# Patient Record
Sex: Male | Born: 1990 | Race: White | Hispanic: No | Marital: Single | Smoking: Current every day smoker
Health system: Southern US, Community
[De-identification: ages and names within clinical notes are randomized; demographics above are authoritative.]

## PROBLEM LIST (undated history)

## (undated) DIAGNOSIS — A630 Anogenital (venereal) warts: Secondary | ICD-10-CM

---

## 2009-03-21 ENCOUNTER — Ambulatory Visit: Payer: Self-pay | Admitting: Psychiatry

## 2009-03-21 ENCOUNTER — Inpatient Hospital Stay (HOSPITAL_COMMUNITY): Admission: EM | Admit: 2009-03-21 | Discharge: 2009-03-22 | Payer: Self-pay | Admitting: Psychiatry

## 2009-03-21 ENCOUNTER — Ambulatory Visit (HOSPITAL_COMMUNITY): Admission: RE | Admit: 2009-03-21 | Discharge: 2009-03-21 | Payer: Self-pay | Admitting: Psychiatry

## 2010-10-19 LAB — COMPREHENSIVE METABOLIC PANEL
ALT: 29 U/L (ref 0–53)
Alkaline Phosphatase: 57 U/L (ref 39–117)
CO2: 28 mEq/L (ref 19–32)
Chloride: 105 mEq/L (ref 96–112)
GFR calc non Af Amer: >60 mL/min (ref >60)
Glucose, Bld: 89 mg/dL (ref 70–99)
Potassium: 3.6 mEq/L (ref 3.5–5.1)
Sodium: 141 mEq/L (ref 135–145)
Total Bilirubin: 0.6 mg/dL (ref 0.3–1.2)

## 2010-10-19 LAB — TSH: TSH: 2.264 u[IU]/mL (ref 0.700–6.400)

## 2010-10-19 LAB — CBC
Hemoglobin: 17 g/dL (ref 13.0–17.0)
MCHC: 34.6 g/dL (ref 30.0–36.0)
RBC: 5.22 MIL/uL (ref 4.22–5.81)
WBC: 9.9 10*3/uL (ref 4.0–10.5)

## 2011-02-04 IMAGING — CT CT HEAD W/O CM
1 series · 16 of 30 positions shown, 20 images · non-contrast
Comparison: None

CLINICAL DATA: Evaluate neurology problems.  The patient complains
of hallucinations, blackouts, syncope, and headaches.

CT HEAD WITHOUT CONTRAST
TECHNIQUE: Contiguous axial images were obtained from the base of
the skull through the vertex without contrast.

[Series 2: headseq 4.8 h45s · axial · 0.43mm/px · z∈[-148,+4]mm · 16 of 36 slices shown, 20 images]
[im 2/36  brain]
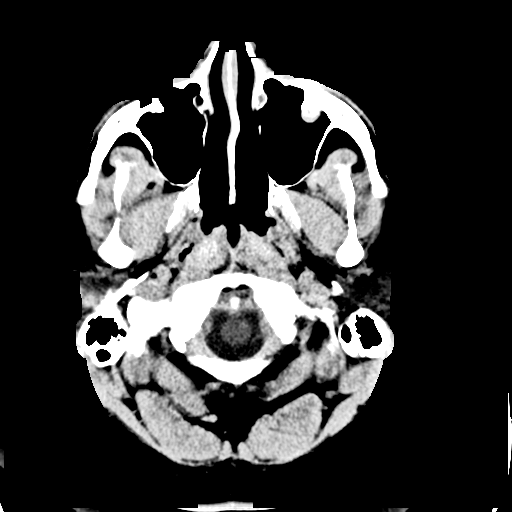
[im 2/36  bone]
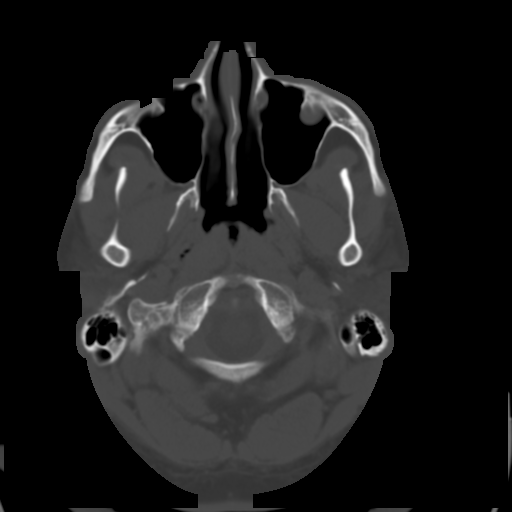
[im 4/36  brain]
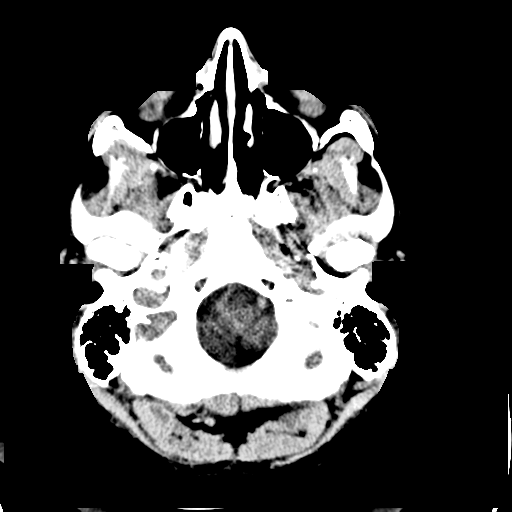
[im 7/36  brain]
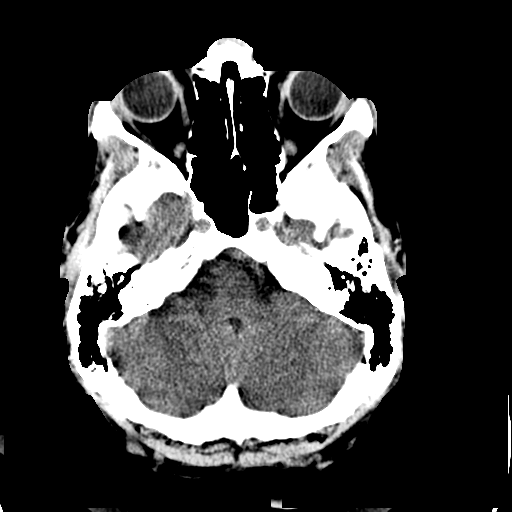
[im 9/36  brain]
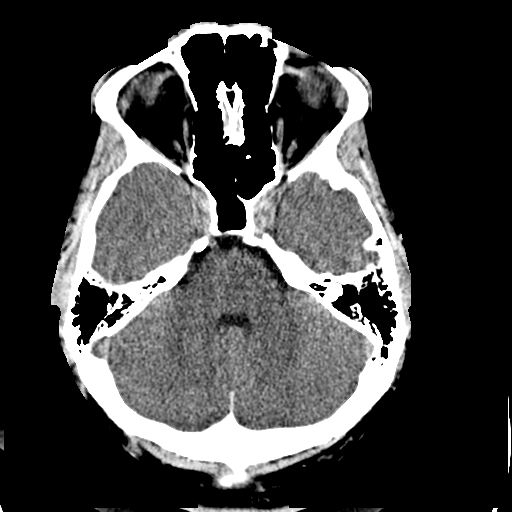
[im 10/36  brain]
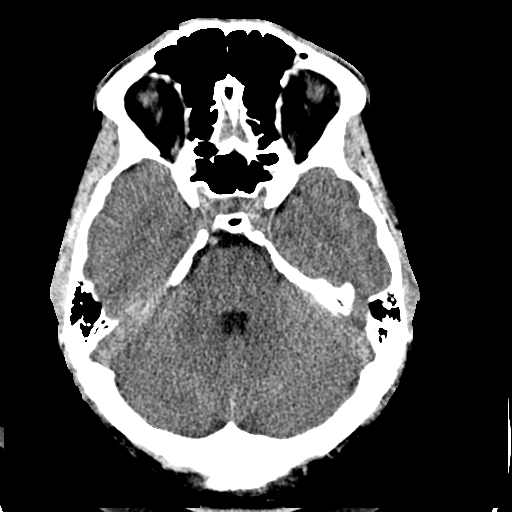
[im 10/36  bone]
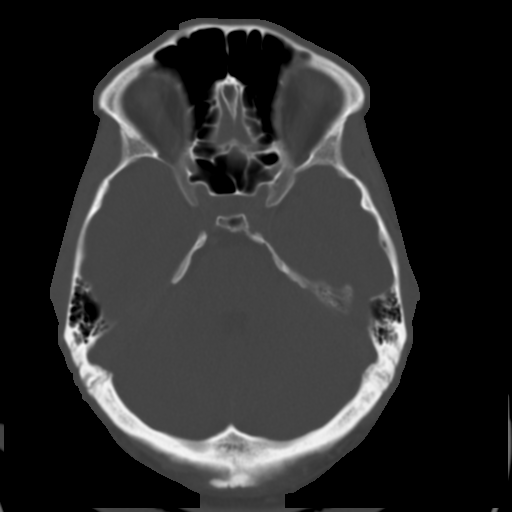
[im 13/36  brain]
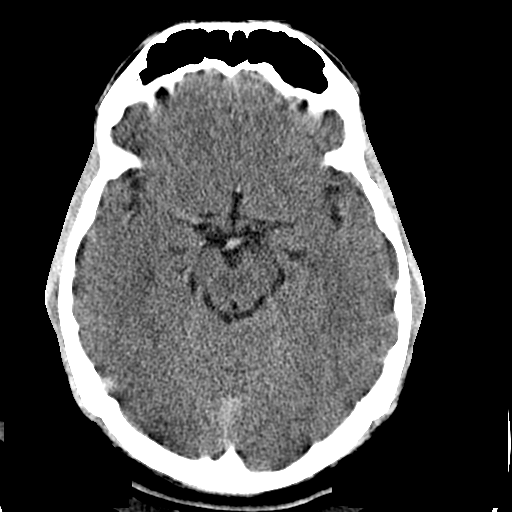
[im 15/36  brain]
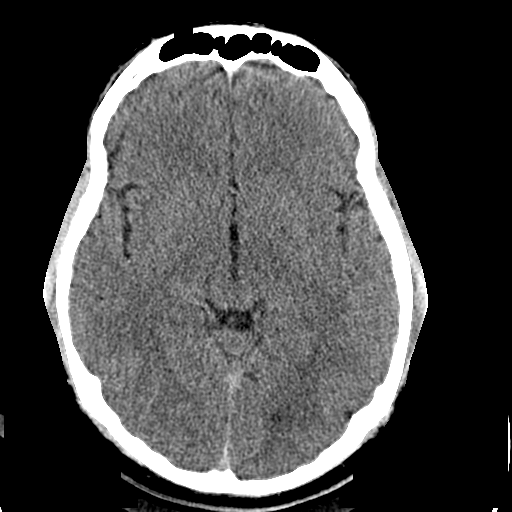
[im 17/36  brain]
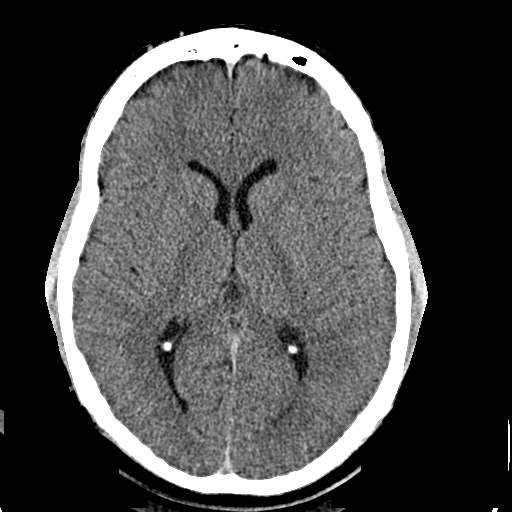
[im 19/36  brain]
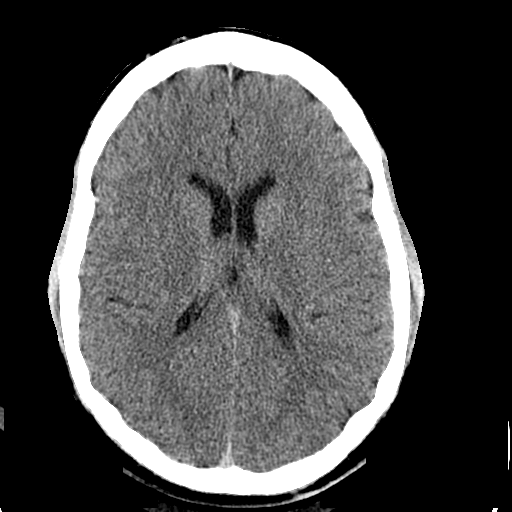
[im 19/36  bone]
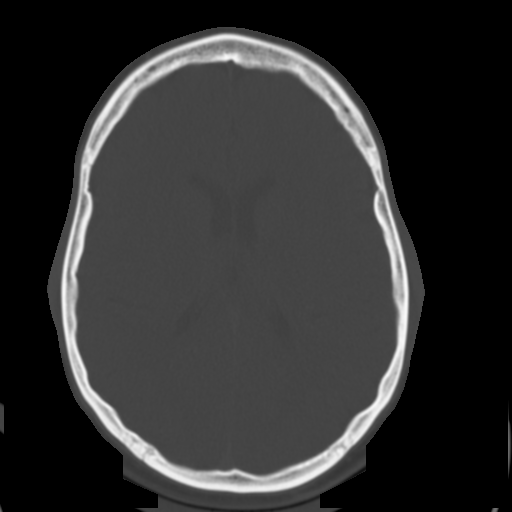
[im 21/36  brain]
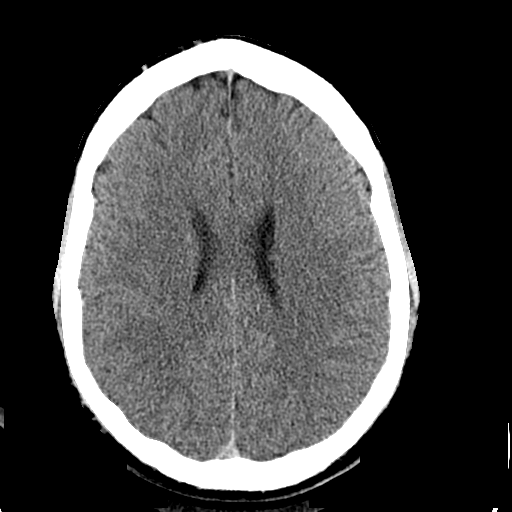
[im 23/36  brain]
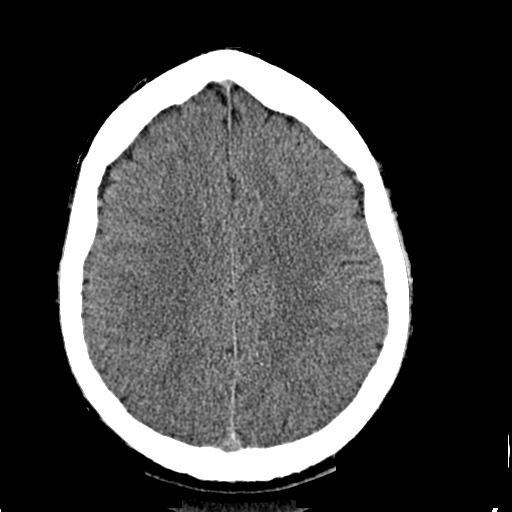
[im 26/36  brain]
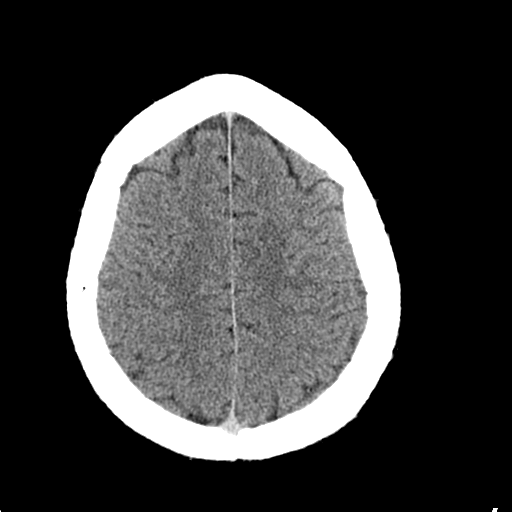
[im 27/36  brain]
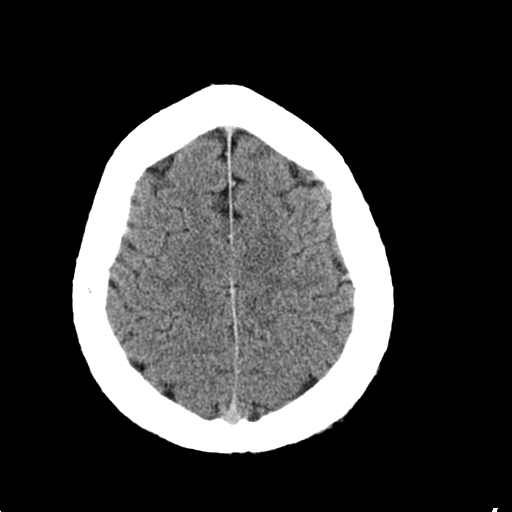
[im 27/36  bone]
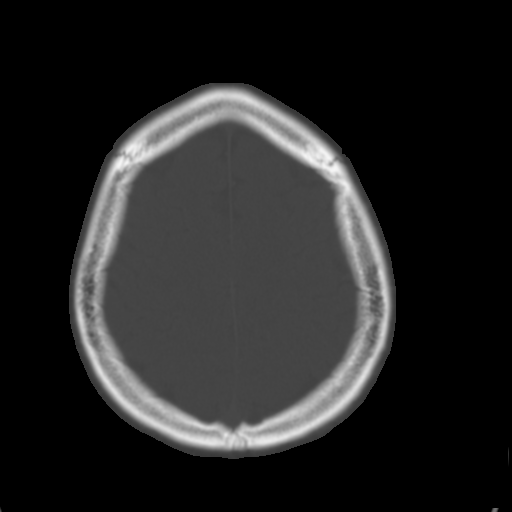
[im 29/36  brain]
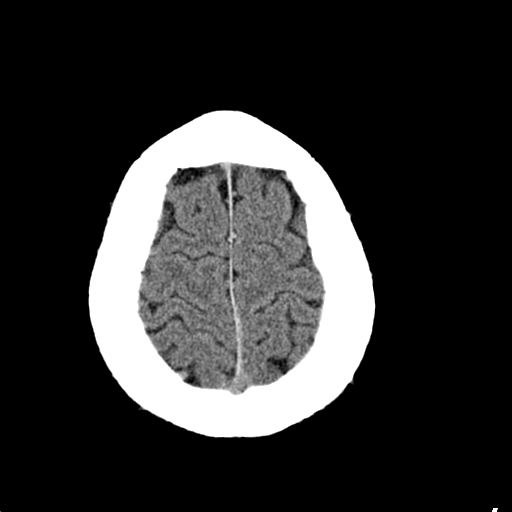
[im 32/36  brain]
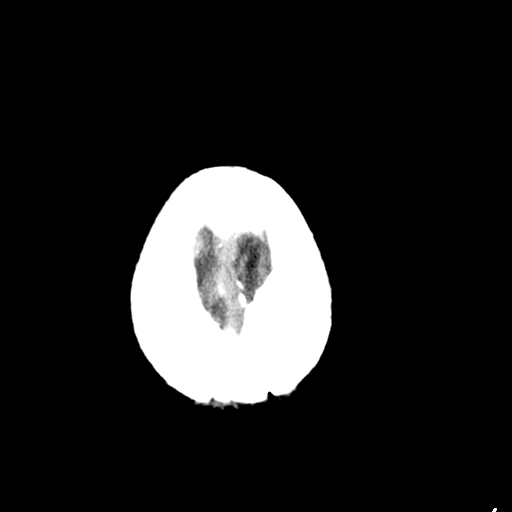
[im 34/36  brain]
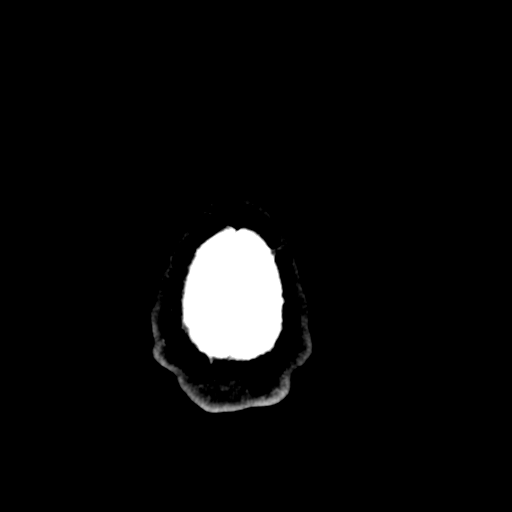

[16 of 30 positions shown; findings below may reference images not displayed]

FINDINGS: No intra or extra-axial hemorrhage, mass effect,
hydrocephalus, mass lesion, or midline shift is identified.  No
evidence of acute infarction is identified.  Soft tissues of the
orbits and scalp are unremarkable.  There is focal polypoid
thickening of the anterior left maxillary sinus measuring at least
9 mm and incompletely visualized.  No air-fluid levels are
identified in the visualized paranasal sinuses.  The visualized
mastoid air cells are clear.  Middle ears are clear.  The scalp is
intact.
IMPRESSION: 1.  No evidence of acute intracranial abnormality.
2.  Probable polyp versus mucous retention cyst in the left
maxillary sinus.

## 2012-03-23 ENCOUNTER — Emergency Department (HOSPITAL_COMMUNITY)
Admission: EM | Admit: 2012-03-23 | Discharge: 2012-03-24 | Discharge status: Against Medical Advice | Payer: BC Managed Care – PPO | Attending: Emergency Medicine | Admitting: Emergency Medicine

## 2012-03-23 ENCOUNTER — Encounter (HOSPITAL_COMMUNITY): Payer: Self-pay | Admitting: Emergency Medicine

## 2012-03-23 DIAGNOSIS — K625 Hemorrhage of anus and rectum: Secondary | ICD-10-CM | POA: Insufficient documentation

## 2012-03-23 HISTORY — DX: Anogenital (venereal) warts: A63.0

## 2012-03-23 NOTE — ED Provider Notes (Signed)
3:47 PM Have gone to the room twice to evaluate the patient and he has not been there.  Gwyneth Sprout, MD 03/23/12 1547

## 2012-03-23 NOTE — ED Notes (Signed)
Pt presenting to ed with c/o rectal bleeding this morning pt states no chest pain, nausea or vomiting. Pt states it only happened once and it was bright red in color. Pt denies dizziness at this time.

## 2014-04-13 ENCOUNTER — Ambulatory Visit (INDEPENDENT_AMBULATORY_CARE_PROVIDER_SITE_OTHER): Payer: BC Managed Care – PPO | Admitting: Family Medicine

## 2014-04-13 VITALS — BP 120/80 | HR 86 | Temp 98.1°F | Resp 16 | Ht 69.0 in | Wt 268.0 lb

## 2014-04-13 DIAGNOSIS — H109 Unspecified conjunctivitis: Secondary | ICD-10-CM

## 2014-04-13 DIAGNOSIS — E669 Obesity, unspecified: Secondary | ICD-10-CM | POA: Insufficient documentation

## 2014-04-13 DIAGNOSIS — H00019 Hordeolum externum unspecified eye, unspecified eyelid: Secondary | ICD-10-CM

## 2014-04-13 DIAGNOSIS — H00016 Hordeolum externum left eye, unspecified eyelid: Secondary | ICD-10-CM

## 2014-04-13 MED ORDER — MOXIFLOXACIN HCL (2X DAY) 0.5 % OP SOLN: 1.0000 [drp] | Freq: Two times a day (BID) | OPHTHALMIC | Status: AC

## 2014-04-13 NOTE — Patient Instructions (Signed)
You seem to have a stye and also mild conjunctivitis. Use the moxeza twice a day for one week, and use hot compresses frequently on your eyelid. If you are not doing better over the next couple of days please let me know- Sooner if worse.

## 2014-04-13 NOTE — Progress Notes (Signed)
[facility] [address], [place] [id number] [phone]  Date:  04/13/2014   Name:  Philip HumbleChristopher Price   DOB:  Aug 05, 1990   MRN:  [id number]  PCP:  No PCP Per Patient    Chief Complaint: Eye Problem   History of Present Illness:  Philip Price is a 23 y.o. very pleasant male patient who presents with the following:  He has noted a "swollen right eye" for about one week.  It seems to be getting worse.  It is not painful or itchy.  His vision is "slightly blurry" but not very much so. He always wears glasses.  He has noted just a little discharge and some am crusting over the last week.   He does not have any pain or photophobia.    He is otherwise generally healthy.   No other sx such as cough or fever.  He has felt tired over the last few weeks but thinks this might be due to stress.    There are no active problems to display for this patient.   Past Medical History  Diagnosis Date  . HPV (human papilloma virus) anogenital infection     History reviewed. No pertinent past surgical history.  History  Substance Use Topics  . Smoking status: Current Every Day Smoker -- 1.00 packs/day    Types: Cigarettes  . Smokeless tobacco: Not on file  . Alcohol Use: No    Family History  Problem Relation Age of Onset  . Cancer Maternal Grandmother   . Heart disease Maternal Grandfather   . High Cholesterol Maternal Grandfather   . High Cholesterol Paternal Grandmother   . Heart disease Paternal Grandmother   . Diabetes Paternal Grandmother   . Cancer Paternal Grandmother   . Diabetes Paternal Grandfather   . Heart disease Paternal Grandfather     No Known Allergies  Medication list has been reviewed and updated.  No current outpatient prescriptions on file prior to visit.   No current facility-administered medications on file prior to visit.    Review of Systems:  As per HPI- otherwise negative.   Physical Examination: Filed Vitals:    04/13/14 1547  BP: 120/80  Pulse: 86  Temp: 98.1 F (36.7 C)  Resp: 16   Filed Vitals:   04/13/14 1547  Height: 5\' 9"  (1.753 m)  Weight: 268 lb (121.564 kg)   Body mass index is 39.56 kg/(m^2). Ideal Body Weight: Weight in (lb) to have BMI = 25: 168.9  GEN: WDWN, NAD, Non-toxic, A & O x , overweight, looks well HEENT: Atraumatic, Normocephalic. Neck supple. No masses, No LAD.  Bilateral TM wnl, oropharynx normal.  PEERL,EOMI.   Ears and Nose: No external deformity. CV: RRR, No M/G/R. No JVD. No thrill. No extra heart sounds. PULM: CTA B, no wheezes, crackles, rhonchi. No retractions. No resp. distress. No accessory muscle use. ABD: S, NT, ND, +BS. No rebound. No HSM. EXTR: No c/c/e NEURO Normal gait.  PSYCH: Normally interactive. Conversant. Not depressed or anxious appearing.  Calm demeanor.  Right eye:  There is a stye on the upper lid which is small and slightly tender. Minimal injection of the right conjuntivae.  Normal fundoscopic exam  Negative fluorescin Assessment and Plan: Conjunctivitis of right eye - Plan: Moxifloxacin HCl 0.5 % SOLN  Stye external, left    Signed Abbe AmsterdamJessica Copland, MD

## 2014-08-12 ENCOUNTER — Ambulatory Visit (HOSPITAL_COMMUNITY): Payer: Self-pay | Admitting: Clinical

## 2014-09-02 ENCOUNTER — Ambulatory Visit (HOSPITAL_COMMUNITY): Payer: Self-pay | Admitting: Psychiatry

## 2015-07-02 ENCOUNTER — Emergency Department (INDEPENDENT_AMBULATORY_CARE_PROVIDER_SITE_OTHER)
Admission: EM | Admit: 2015-07-02 | Discharge: 2015-07-02 | Disposition: A | Discharge status: Home / Self Care | Payer: Self-pay | Source: Home / Self Care | Attending: Emergency Medicine | Admitting: Emergency Medicine

## 2015-07-02 ENCOUNTER — Encounter (HOSPITAL_COMMUNITY): Payer: Self-pay | Admitting: Emergency Medicine

## 2015-07-02 DIAGNOSIS — H109 Unspecified conjunctivitis: Secondary | ICD-10-CM

## 2015-07-02 MED ORDER — ERYTHROMYCIN 5 MG/GM OP OINT: TOPICAL_OINTMENT | OPHTHALMIC | Status: AC

## 2015-07-02 NOTE — Discharge Instructions (Signed)
You have conjunctivitis, or pink eye. This is usually caused by a virus or bacteria. Use the erythromycin ointment 3 times a day. Please call Dr. Laruth BouchardGroat's office tomorrow morning to set up an appointment.

## 2015-07-02 NOTE — ED Notes (Signed)
Seen  by provider only

## 2015-07-02 NOTE — ED Provider Notes (Signed)
CSN: [id number]     Arrival date & time 07/02/15  1506 History   First MD Initiated Contact with Patient 07/02/15 1542     No chief complaint on file. CC: eye irritation  (Consider location/radiation/quality/duration/timing/severity/associated sxs/prior Treatment) HPI  He is a 24 year old man here for evaluation of eye irritation. He states this started 4 or 5 days ago with redness and drainage from the right eye. He states the right eyelid is very heavy. He also reports a lot of drainage from the eye that is causing blurred vision. He states it is hard to keep his right eye open due to the heaviness of the lid. He denies any sensitivity to light. He denies any frank pain or pain with eye movements. He states he is starting to see some redness and watery drainage from the left eye as well. He is concerned about a fungal infection as he had ringworm on his right thigh a week and a half ago.  Past Medical History  Diagnosis Date  . HPV (human papilloma virus) anogenital infection    No past surgical history on file. Family History  Problem Relation Age of Onset  . Cancer Maternal Grandmother   . Heart disease Maternal Grandfather   . High Cholesterol Maternal Grandfather   . High Cholesterol Paternal Grandmother   . Heart disease Paternal Grandmother   . Diabetes Paternal Grandmother   . Cancer Paternal Grandmother   . Diabetes Paternal Grandfather   . Heart disease Paternal Grandfather    Social History  Substance Use Topics  . Smoking status: Current Every Day Smoker -- 1.00 packs/day    Types: Cigarettes  . Smokeless tobacco: Not on file  . Alcohol Use: No    Review of Systems As in history of present illness Allergies  Review of patient's allergies indicates no known allergies.  Home Medications   Prior to Admission medications   Medication Sig Start Date End Date Taking? Authorizing Provider  erythromycin ophthalmic ointment Place a 1/2 inch ribbon of ointment into the  lower eyelid 3 times a day. 07/02/15   Charm RingsErin J Whitleigh Garramone, MD  Moxifloxacin HCl 0.5 % SOLN Apply 1 drop to eye 2 (two) times daily. 04/13/14   Pearline CablesJessica C Copland, MD   Meds Ordered and Administered this Visit  Medications - No data to display  BP 134/82 mmHg  Pulse 109  Temp(Src) 99.5 F (37.5 C) (Oral)  Resp 18  SpO2 97% No data found.   Physical Exam  Constitutional: He is oriented to person, place, and time. He appears well-developed and well-nourished. No distress.  Eyes: EOM are normal. Pupils are equal, round, and reactive to light. Right eye exhibits discharge (thick yellow to white). Left eye exhibits discharge (Watery).  Right upper eyelid is edematous. The right conjunctiva is beefy red and inflamed. Cornea is clear. Left eye with mild conjunctival erythema.  Cardiovascular:  Mild tachycardia  Pulmonary/Chest: Effort normal.  Neurological: He is alert and oriented to person, place, and time.    ED Course  Procedures (including critical care time)  Labs Review Labs Reviewed - No data to display  Imaging Review No results found.   Visual Acuity Review  Right Eye Distance: 20/200 w/corrections Left Eye Distance: 20/40 w/corrections Bilateral Distance: 20/40 w/correctionss  Right Eye Near:   Left Eye Near:    Bilateral Near:         MDM   1. Bilateral conjunctivitis    Given change in vision and extreme redness, I  did speak with Dr. Dione Booze, on-call ophthalmologist. Will start him on erythromycin ointment and have him follow-up at his office tomorrow.    Charm Rings, MD 07/02/15 [phone]

## 2016-05-03 ENCOUNTER — Ambulatory Visit (HOSPITAL_COMMUNITY)
Admission: EM | Admit: 2016-05-03 | Discharge: 2016-05-03 | Disposition: A | Discharge status: Home / Self Care | Payer: Self-pay

## 2016-05-03 ENCOUNTER — Emergency Department (HOSPITAL_COMMUNITY)
Admission: EM | Admit: 2016-05-03 | Discharge: 2016-05-03 | Disposition: A | Discharge status: Home / Self Care | Payer: Self-pay | Attending: Dermatology | Admitting: Dermatology

## 2016-05-03 ENCOUNTER — Encounter (HOSPITAL_COMMUNITY): Payer: Self-pay | Admitting: Nurse Practitioner

## 2016-05-03 DIAGNOSIS — Z5321 Procedure and treatment not carried out due to patient leaving prior to being seen by health care provider: Secondary | ICD-10-CM | POA: Insufficient documentation

## 2016-05-03 DIAGNOSIS — Z0441 Encounter for examination and observation following alleged adult rape: Secondary | ICD-10-CM | POA: Insufficient documentation

## 2016-05-03 NOTE — ED Notes (Signed)
Pt walked out of ed, handed bp cuff to staff and left. Would not stop to talk.
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
